# Patient Record
Sex: Male | Born: 1975 | Marital: Single | Smoking: Never smoker
Health system: Southern US, Community
[De-identification: ages and names within clinical notes are randomized; demographics above are authoritative.]

---

## 2019-11-03 ENCOUNTER — Emergency Department (HOSPITAL_COMMUNITY): Payer: Self-pay

## 2019-11-03 ENCOUNTER — Other Ambulatory Visit: Payer: Self-pay

## 2019-11-03 ENCOUNTER — Encounter (HOSPITAL_COMMUNITY): Payer: Self-pay | Admitting: Emergency Medicine

## 2019-11-03 ENCOUNTER — Emergency Department (HOSPITAL_COMMUNITY)
Admission: EM | Admit: 2019-11-03 | Discharge: 2019-11-03 | Disposition: A | Discharge status: Home / Self Care | Payer: Self-pay | Attending: Emergency Medicine | Admitting: Emergency Medicine

## 2019-11-03 DIAGNOSIS — M25562 Pain in left knee: Secondary | ICD-10-CM | POA: Insufficient documentation

## 2019-11-03 MED ORDER — NAPROXEN 500 MG PO TABS: 500.0000 mg | ORAL_TABLET | Freq: Two times a day (BID) | ORAL | 0 refills | Status: AC

## 2019-11-03 NOTE — ED Provider Notes (Signed)
[facility] EMERGENCY DEPARTMENT Provider Note   CSN: [id number] Arrival date & time: 11/03/19  1411     History Chief Complaint  Patient presents with  . Knee Pain    William Short is a 44 y.o. male without significant past medical history who presents to the emergency department with left knee pain since around 09/23/19.  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  He has since had fairly constant pain to the left knee, worse with movement and squatting positions, no alleviating factors.  No intervention prior to arrival.  Denies numbness, tingling, weakness, fever, redness, or chills.  HPI     History reviewed. No pertinent past medical history.  There are no problems to display for this patient.   History reviewed. No pertinent surgical history.     No family history on file.  Social History   Tobacco Use  . Smoking status: Never Smoker  . Smokeless tobacco: Never Used  Substance Use Topics  . Alcohol use: Not Currently  . Drug use: Not Currently    Home Medications Prior to Admission medications   Not on File    Allergies    Patient has no allergy information on record.  Review of Systems   Review of Systems  Constitutional: Negative for chills and fever.  Respiratory: Negative for shortness of breath.   Cardiovascular: Negative for chest pain.  Gastrointestinal: Negative for nausea and vomiting.  Musculoskeletal: Positive for arthralgias.  Skin: Negative for color change, rash and wound.  Neurological: Negative for numbness.    Physical Exam Updated Vital Signs BP (!) 146/108 (BP Location: Right Arm)   Pulse 85   Temp 98.4 F (36.9 C) (Oral)   Resp 16   SpO2 100%   Physical Exam Vitals and nursing note reviewed.  Constitutional:      General: He is not in acute distress.    Appearance: He is not ill-appearing or  toxic-appearing.  HENT:     Head: Normocephalic and atraumatic.  Cardiovascular:     Pulses:          Dorsalis pedis pulses are 2+ on the right side and 2+ on the left side.       Posterior tibial pulses are 2+ on the right side and 2+ on the left side.  Pulmonary:     Effort: Pulmonary effort is normal.  Musculoskeletal:     Comments: Lower extremities: No obvious deformity, appreciable swelling, edema, erythema, ecchymosis, warmth, or open wounds. Patient has intact AROM to bilateral hips, knees, ankles, and all digits. Tender to palpation to the left knee medial and lateral joint line as well as the medial and lateral femoral condyles.  No obvious joint instability.  No discomfort or instability with valgus/varus stress testing.  Neurovascularly intact distally.  Compartments are soft.  No calf tenderness.  Skin:    General: Skin is warm and dry.     Capillary Refill: Capillary refill takes less than 2 seconds.  Neurological:     Mental Status: He is alert.     Comments: Alert. Clear speech. Sensation grossly intact to bilateral lower extremities. 5/5 strength with plantar/dorsiflexion bilaterally. Patient ambulatory.   Psychiatric:        Mood and Affect: Mood normal.        Behavior: Behavior normal.     ED Results /  Procedures / Treatments   Labs (all labs ordered are listed, but only abnormal results are displayed) Labs Reviewed - No data to display  EKG None  Radiology DG Knee Complete 4 Views Left  Result Date: 11/03/2019 CLINICAL DATA:  Left knee pain EXAM: LEFT KNEE - COMPLETE 4+ VIEW COMPARISON:  None. FINDINGS: Normal alignment without acute osseous finding or fracture. Mild patellofemoral osteoarthritis with sclerosis and subchondral cystic change. No large effusion. IMPRESSION: Left knee patellofemoral osteoarthritis. No other acute finding by plain radiography. Electronically Signed   By: Judie Petit.  Shick M.D.   On: 11/03/2019 15:15    Procedures Procedures (including  critical care time)  SPLINT APPLICATION Date/Time: 4:39 PM Authorized by: Harvie Heck Consent: Verbal consent obtained. Risks and benefits: risks, benefits and alternatives were discussed Consent given by: patient Splint applied by: Harvie Heck PA-C Location details: LLE Splint type: L knee immobilizer Supplies used: L knee immobilizer.  Post-procedure: The splinted body part was neurovascularly unchanged following the procedure. Patient tolerance: Patient tolerated the procedure well with no immediate complications.    Medications Ordered in ED Medications - No data to display  ED Course  I have reviewed the triage vital signs and the nursing notes.  Pertinent labs & imaging results that were available during my care of the patient were reviewed by me and considered in my medical decision making (see chart for details).    MDM Rules/Calculators/A&P                      Patient presents to the emergency department with left knee pain after popping sensation while working on his knees about 1.5 months prior. Nontoxic-appearing, resting comfortably, vitals WNL with exception of elevated blood pressure, doubt HTN emergency.  No fever, erythema, warmth, or wounds, not consistent with septic joint.  X-ray with patellofemoral osteoarthritis, no acute findings.  No fracture/dislocation.  Good range of motion, joint is not erythematous, gout seems less likely.  No obvious joint instability.  Possibly ligamentous or meniscal injury.  Unclear definitive etiology, will place in knee immobilizer, trial naproxen, orthopedics follow-up.I discussed results, treatment plan, need for follow-up, and return precautions with the patient. Provided opportunity for questions, patient confirmed understanding and is in agreement with plan.     Final Clinical Impression(s) / ED Diagnoses Final diagnoses:  Left knee pain, unspecified chronicity    Rx / DC Orders ED Discharge Orders          Ordered    naproxen (NAPROSYN) 500 MG tablet  2 times daily     11/03/19 1631           Rashonda Warrior, Zion R, PA-C 11/03/19 2304    Arby Barrette, MD 11/11/19 1114

## 2019-11-03 NOTE — ED Triage Notes (Signed)
L knee pain since on knee working on 11/22.  States it feels like something tore.

## 2019-11-03 NOTE — ED Notes (Signed)
Called x2 for triage -- will retry in a few

## 2019-11-03 NOTE — Discharge Instructions (Signed)
Please read and follow all provided instructions.  You have been seen today for left knee pain.  Tests performed today include: An x-ray of the affected area - does NOT show any broken bones or dislocations. There was signs of arthritis.  Vital signs. See below for your results today.   Home care instructions: -- *PRICE in the first 24-48 hours  Protect (with brace, splint, sling), if given by your provider Rest Ice- Do not apply ice pack directly to your skin, place towel or similar between your skin and ice/ice pack. Apply ice for 20 min, then remove for 40 min while awake Compression- Wear brace, elastic bandage, splint as directed by your provider Elevate affected extremity above the level of your heart when not walking around for the first 24-48 hours   Medications:  - Naproxen is a nonsteroidal anti-inflammatory medication that will help with pain and swelling. Be sure to take this medication as prescribed with food, 1 pill every 12 hours,  It should be taken with food, as it can cause stomach upset, and more seriously, stomach bleeding. Do not take other nonsteroidal anti-inflammatory medications with this such as Advil, Motrin, Aleve, Mobic, Goodie Powder, or Motrin.   You make take Tylenol per over the counter dosing with these medications.   We have prescribed you new medication(s) today. Discuss the medications prescribed today with your pharmacist as they can have adverse effects and interactions with your other medicines including over the counter and prescribed medications. Seek medical evaluation if you start to experience new or abnormal symptoms after taking one of these medicines, seek care immediately if you start to experience difficulty breathing, feeling of your throat closing, facial swelling, or rash as these could be indications of a more serious allergic reaction   Follow-up instructions: Please follow-up with your primary care provider or the provided orthopedic  physician (bone specialist) if you continue to have significant pain in 1 week. In this case you may have a more severe injury that requires further care.   Return instructions:  Please return if your digits or extremity are numb or tingling, appear gray or blue, or you have severe pain (also elevate the extremity and loosen splint or wrap if you were given one) Please return if you have redness or fevers.  Please return to the Emergency Department if you experience worsening symptoms.  Please return if you have any other emergent concerns. Additional Information:  Your vital signs today were: BP (!) 146/108 (BP Location: Right Arm)   Pulse 85   Temp 98.4 F (36.9 C) (Oral)   Resp 16   SpO2 100%  If your blood pressure (BP) was elevated above 135/85 this visit, please have this repeated by your doctor within one month. ---------------

## 2021-06-17 IMAGING — DX DG KNEE COMPLETE 4+V*L*
4 series · 4 of 4 positions shown · non-contrast
Comparison: None.

CLINICAL DATA: Left knee pain

EXAM:
LEFT KNEE - COMPLETE 4+ VIEW

[t knee ap left]
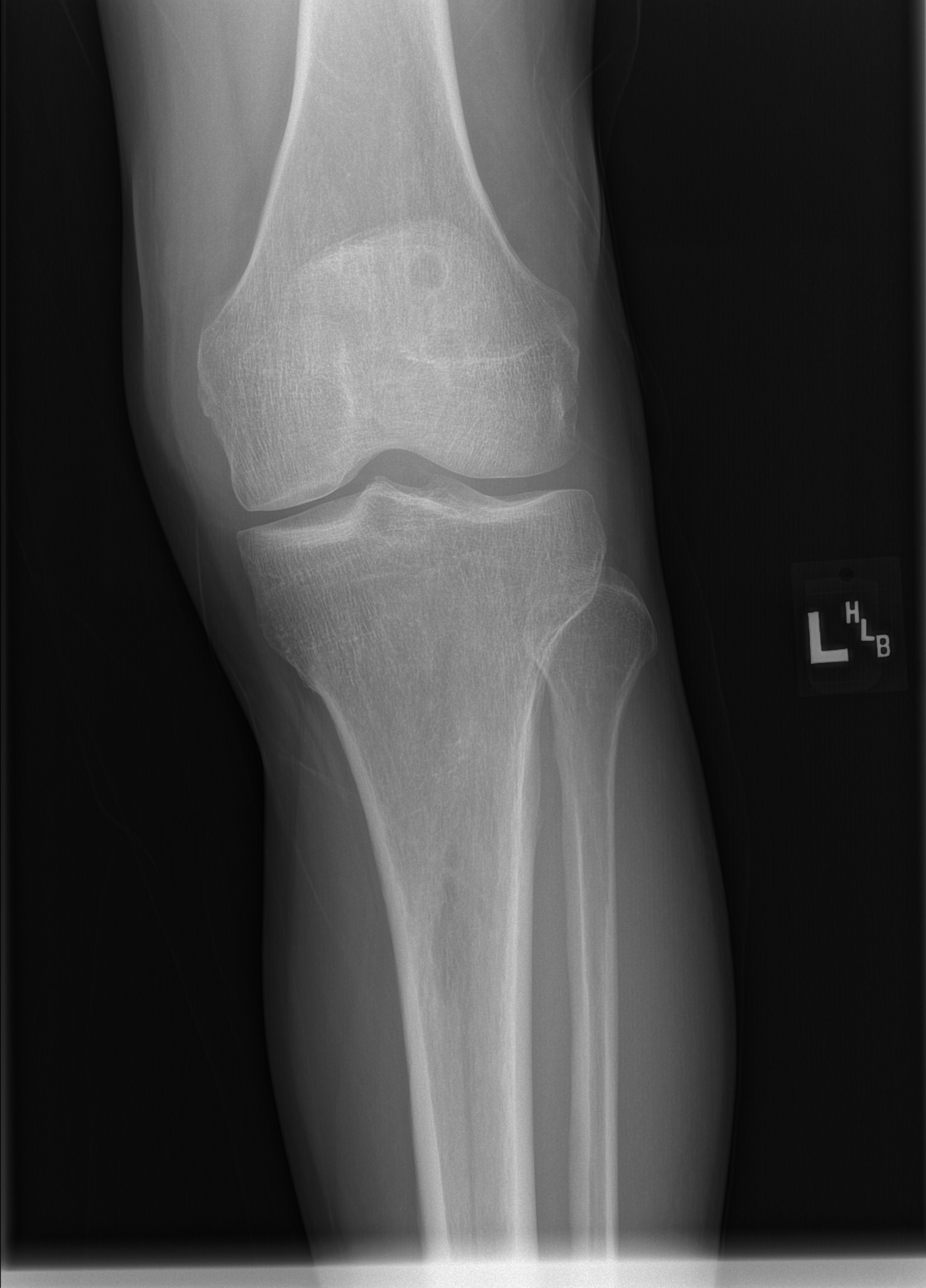

[t knee obl left]
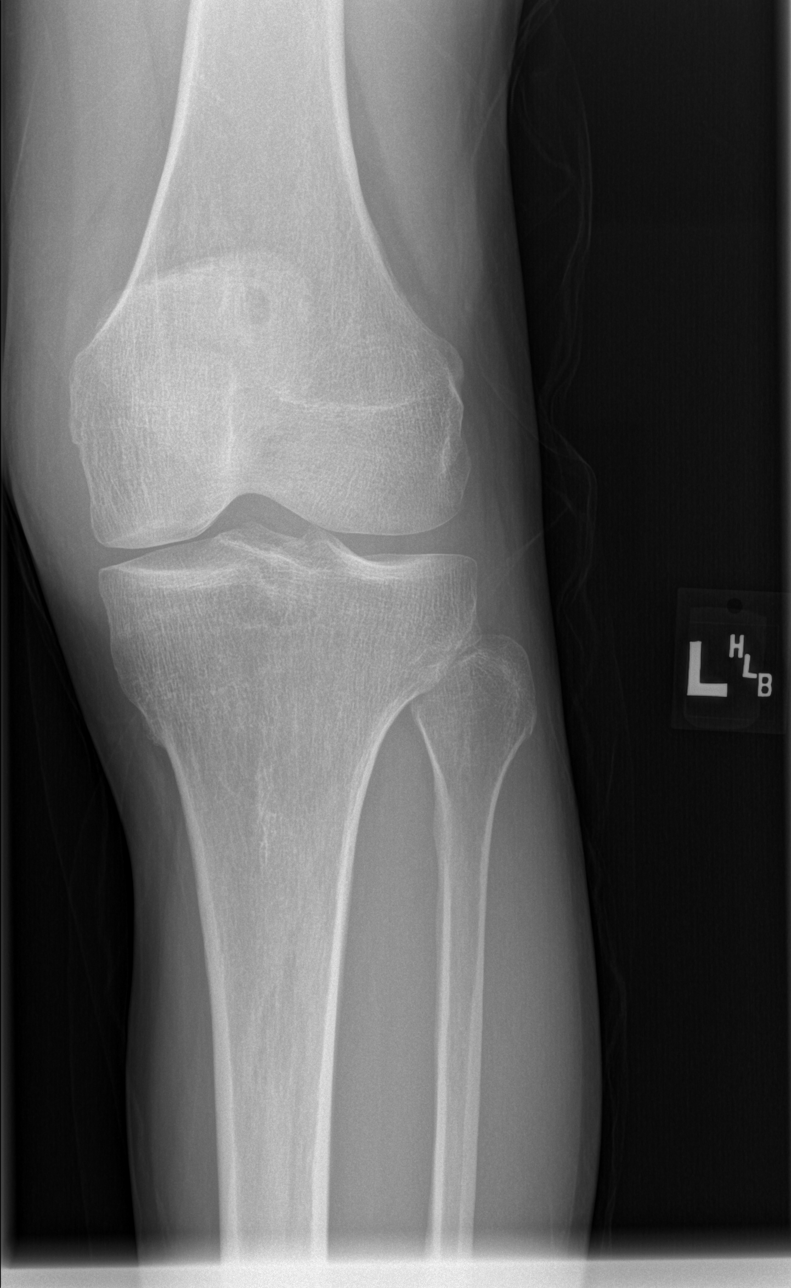

[t knee tunnel left]
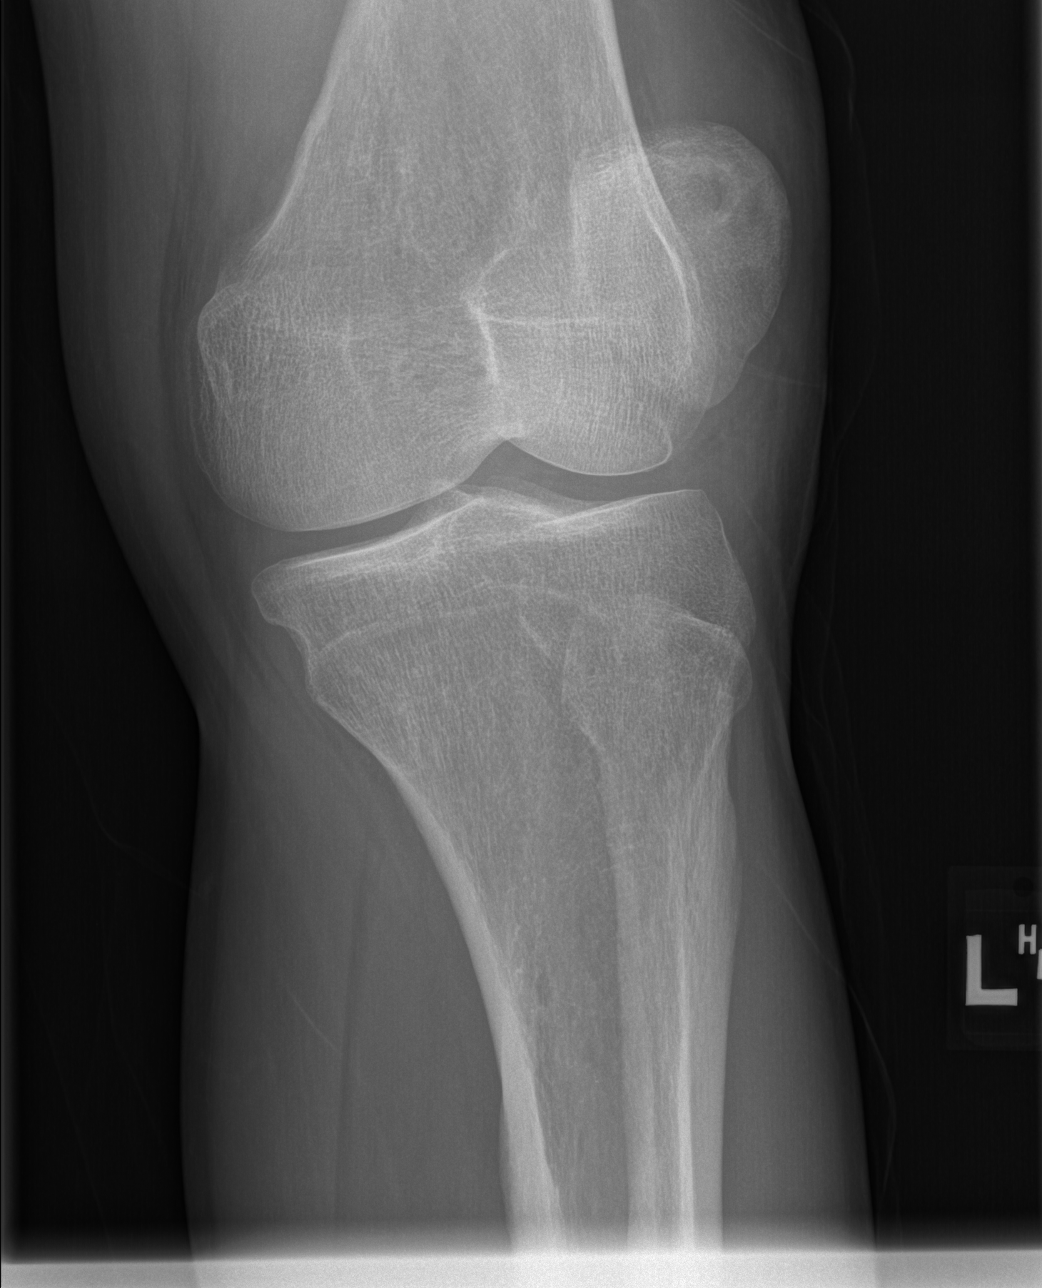

[t knee lat left]
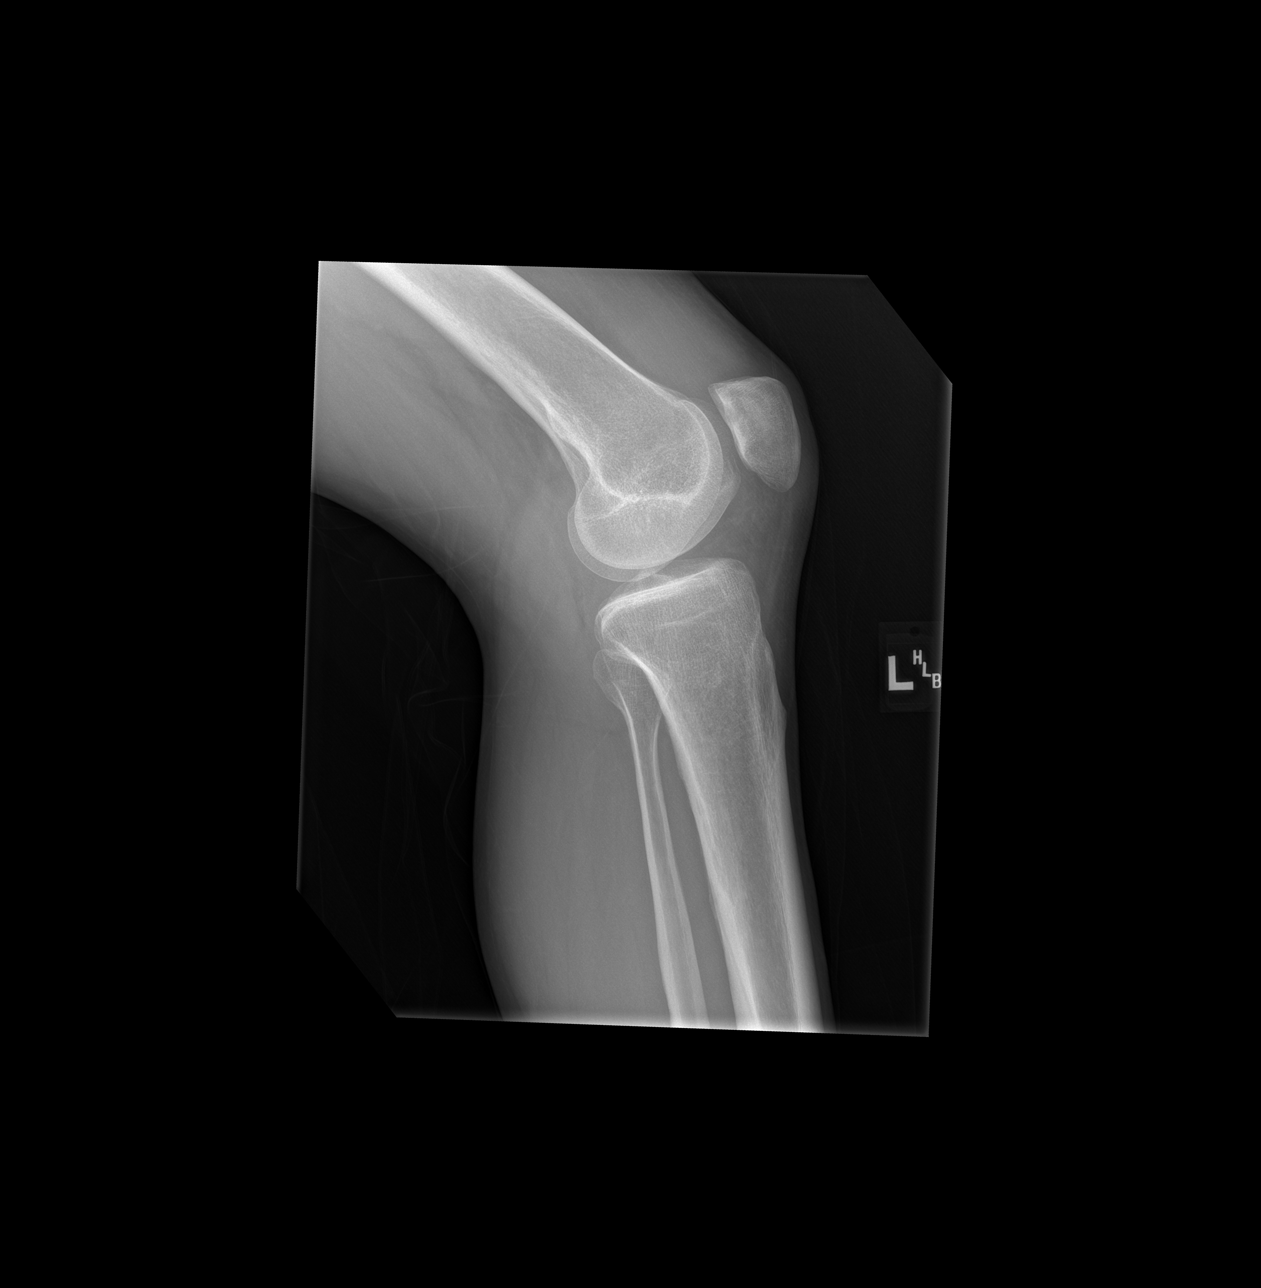

[4 of 4 positions shown; findings below may reference images not displayed]

FINDINGS: Normal alignment without acute osseous finding or fracture. Mild
patellofemoral osteoarthritis with sclerosis and subchondral cystic
change. No large effusion.
IMPRESSION: Left knee patellofemoral osteoarthritis. No other acute finding by
plain radiography.

## 2024-09-12 ENCOUNTER — Other Ambulatory Visit (HOSPITAL_COMMUNITY): Payer: Self-pay
